# Patient Record
Sex: Male | Born: 1950
Health system: Southern US, Community
[De-identification: ages and names within clinical notes are randomized; demographics above are authoritative.]

## PROBLEM LIST (undated history)

## (undated) DIAGNOSIS — E785 Hyperlipidemia, unspecified: Secondary | ICD-10-CM

## (undated) HISTORY — DX: Hyperlipidemia, unspecified: E78.5

---

## 2005-05-27 ENCOUNTER — Ambulatory Visit: Payer: Self-pay | Admitting: Gastroenterology

## 2005-06-17 ENCOUNTER — Ambulatory Visit: Payer: Self-pay | Admitting: Gastroenterology

## 2005-06-17 HISTORY — PX: COLONOSCOPY: SHX174

## 2013-09-07 ENCOUNTER — Encounter: Payer: Self-pay | Admitting: Gastroenterology

## 2014-10-05 ENCOUNTER — Telehealth: Payer: Self-pay | Admitting: *Deleted

## 2015-06-13 ENCOUNTER — Encounter: Payer: Self-pay | Admitting: Internal Medicine

## 2015-08-09 ENCOUNTER — Ambulatory Visit (AMBULATORY_SURGERY_CENTER): Payer: Self-pay | Admitting: *Deleted

## 2015-08-09 VITALS — Ht 68.0 in | Wt 194.0 lb

## 2015-08-09 DIAGNOSIS — Z1211 Encounter for screening for malignant neoplasm of colon: Secondary | ICD-10-CM

## 2015-08-09 NOTE — Progress Notes (Signed)
No egg or soy allergy known to patient  No issues with past sedation with any surgeries  or procedures, no intubation in the past  No diet pills per patient No home 02 use per patient  No blood thinners per patient

## 2015-08-23 ENCOUNTER — Ambulatory Visit (AMBULATORY_SURGERY_CENTER): Payer: BLUE CROSS/BLUE SHIELD | Admitting: Internal Medicine

## 2015-08-23 ENCOUNTER — Encounter: Payer: Self-pay | Admitting: Internal Medicine

## 2015-08-23 VITALS — BP 116/74 | HR 59 | Temp 97.0°F | Resp 16 | Ht 68.0 in | Wt 194.0 lb

## 2015-08-23 DIAGNOSIS — K635 Polyp of colon: Secondary | ICD-10-CM | POA: Diagnosis not present

## 2015-08-23 DIAGNOSIS — Z1211 Encounter for screening for malignant neoplasm of colon: Secondary | ICD-10-CM

## 2015-08-23 DIAGNOSIS — D120 Benign neoplasm of cecum: Secondary | ICD-10-CM | POA: Diagnosis not present

## 2015-08-23 MED ORDER — SODIUM CHLORIDE 0.9 % IV SOLN: 500.0000 mL | INTRAVENOUS | Status: DC

## 2015-08-23 NOTE — Progress Notes (Signed)
Report to PACU, RN, vss, BBS= Clear.  

## 2015-08-23 NOTE — Progress Notes (Signed)
Called to room to assist during endoscopic procedure.  Patient ID and intended procedure confirmed with present staff. Received instructions for my participation in the procedure from the performing physician.  

## 2015-08-23 NOTE — Op Note (Signed)
[place] Patient Name: Trevor Wu Procedure Date: 08/23/2015 9:50 AM MRN: [id number] Endoscopist: Gatha Mayer , MD Age: 65 Referring MD:  Date of Birth: January 18, 1951 Gender: Male Procedure:                Colonoscopy Indications:              Screening for colorectal malignant neoplasm Medicines:                Propofol total dose 200 mg IV, Monitored Anesthesia                            Care Procedure:                Pre-Anesthesia Assessment:                           - Prior to the procedure, a History and Physical                            was performed, and patient medications and                            allergies were reviewed. The patient's tolerance of                            previous anesthesia was also reviewed. The risks                            and benefits of the procedure and the sedation                            options and risks were discussed with the patient.                            All questions were answered, and informed consent                            was obtained. Prior Anticoagulants: The patient has                            taken no previous anticoagulant or antiplatelet                            agents. ASA Grade Assessment: II - A patient with                            mild systemic disease. After reviewing the risks                            and benefits, the patient was deemed in                            satisfactory condition to undergo the procedure.  After obtaining informed consent, the colonoscope                            was passed under direct vision. Throughout the                            procedure, the patient's blood pressure, pulse, and                            oxygen saturations were monitored continuously. The                            Model CF-HQ190L [id number]) scope was introduced                            through the anus and advanced to the the cecum,                      identified by appendiceal orifice and ileocecal                            valve. The colonoscopy was performed without                            difficulty. The patient tolerated the procedure                            well. The quality of the bowel preparation was                            good. The ileocecal valve, appendiceal orifice, and                            rectum were photographed. The bowel preparation                            used was Miralax. Scope In: 10:02:24 AM Scope Out: 10:13:31 AM Scope Withdrawal Time: 0 hours 9 minutes 21 seconds  Total Procedure Duration: 0 hours 11 minutes 7 seconds  Findings:      The perianal and digital rectal examinations were normal. Pertinent       negatives include normal prostate (size, shape, and consistency).      A 2 mm polyp was found in the ileocecal valve. The polyp was sessile.       The polyp was removed with a cold biopsy forceps. Resection and       retrieval were complete.      Multiple small and large-mouthed diverticula were found in the sigmoid       colon and descending colon. There was narrowing of the colon in       association with the diverticular opening.      The exam was otherwise without abnormality on direct and retroflexion       views. Complications:            No immediate complications. Estimated Blood Loss:     Estimated blood loss: none. Impression:               -  One 2 mm polyp at the ileocecal valve, removed                            with a cold biopsy forceps. Resected and retrieved.                           - Severe diverticulosis in the sigmoid colon and in                            the descending colon. There was narrowing of the                            colon in association with the diverticular opening.                           - The examination was otherwise normal on direct                            and retroflexion views. Recommendation:           - Patient has a  contact number available for                            emergencies. The signs and symptoms of potential                            delayed complications were discussed with the                            patient. Return to normal activities tomorrow.                            Written discharge instructions were provided to the                            patient.                           - Resume previous diet.                           - Continue present medications.                           - Await pathology results.                           - Repeat colonoscopy for surveillance based on                            pathology results. Procedure Code(s):        --- Professional ---                           [phone], Colonoscopy, flexible; with biopsy, single  or multiple CPT copyright 2016 American Medical Association. All rights reserved. Gatha Mayer, MD Gatha Mayer, MD 08/23/2015 10:22:06 AM Number of Addenda: 0

## 2015-08-23 NOTE — Patient Instructions (Addendum)
One tiny polyp (?) removed. You also have a condition called diverticulosis - common and not usually a problem. Please read the handout provided.  I will let you know pathology results and when to have another routine colonoscopy by mail.  I appreciate the opportunity to care for you. Gatha Mayer, MD, FACG  YOU HAD AN ENDOSCOPIC PROCEDURE TODAY AT [facility]:   Refer to the procedure report that was given to you for any specific questions about what was found during the examination.  If the procedure report does not answer your questions, please call your gastroenterologist to clarify.  If you requested that your care partner not be given the details of your procedure findings, then the procedure report has been included in a sealed envelope for you to review at your convenience later.  YOU SHOULD EXPECT: Some feelings of bloating in the abdomen. Passage of more gas than usual.  Walking can help get rid of the air that was put into your GI tract during the procedure and reduce the bloating. If you had a lower endoscopy (such as a colonoscopy or flexible sigmoidoscopy) you may notice spotting of blood in your stool or on the toilet paper. If you underwent a bowel prep for your procedure, you may not have a normal bowel movement for a few days.  Please Note:  You might notice some irritation and congestion in your nose or some drainage.  This is from the oxygen used during your procedure.  There is no need for concern and it should clear up in a day or so.  SYMPTOMS TO REPORT IMMEDIATELY:   Following lower endoscopy (colonoscopy or flexible sigmoidoscopy):  Excessive amounts of blood in the stool  Significant tenderness or worsening of abdominal pains  Swelling of the abdomen that is new, acute  Fever of 100F or higher   For urgent or emergent issues, a gastroenterologist can be reached at any hour by calling [phone].   DIET: Your first meal following  the procedure should be a small meal and then it is ok to progress to your normal diet. Heavy or fried foods are harder to digest and may make you feel nauseous or bloated.  Likewise, meals heavy in dairy and vegetables can increase bloating.  Drink plenty of fluids but you should avoid alcoholic beverages for 24 hours.  ACTIVITY:  You should plan to take it easy for the rest of today and you should NOT DRIVE or use heavy machinery until tomorrow (because of the sedation medicines used during the test).    FOLLOW UP: Our staff will call the number listed on your records the next business day following your procedure to check on you and address any questions or concerns that you may have regarding the information given to you following your procedure. If we do not reach you, we will leave a message.  However, if you are feeling well and you are not experiencing any problems, there is no need to return our call.  We will assume that you have returned to your regular daily activities without incident.  If any biopsies were taken you will be contacted by phone or by letter within the next 1-3 weeks.  Please call us at [phone] if you have not heard about the biopsies in 3 weeks.    SIGNATURES/CONFIDENTIALITY: You and/or your care partner have signed paperwork which will be entered into your electronic medical record.  These signatures attest to the fact  that that the information above on your After Visit Summary has been reviewed and is understood.  Full responsibility of the confidentiality of this discharge information lies with you and/or your care-partner.  Polyp/ diverticulosis handout given Await pathology results Resume medications and diet

## 2015-08-24 ENCOUNTER — Telehealth: Payer: Self-pay | Admitting: Emergency Medicine

## 2015-08-24 NOTE — Telephone Encounter (Signed)
  Follow up Call-  Call back number 08/23/2015  Post procedure Call Back phone  # [phone]  Permission to leave phone message Yes     Patient questions:  Do you have a fever, pain , or abdominal swelling? No. Pain Score  0 *  Have you tolerated food without any problems? Yes.    Have you been able to return to your normal activities? Yes.    Do you have any questions about your discharge instructions: Diet   No. Medications  No. Follow up visit  No.  Do you have questions or concerns about your Care? No.  Actions: * If pain score is 4 or above: No action needed, pain <4.

## 2015-08-31 ENCOUNTER — Encounter: Payer: Self-pay | Admitting: Internal Medicine

## 2015-08-31 NOTE — Progress Notes (Signed)
Quick Note:  Not a precancerous polyp Recall 2027 ______

## 2016-04-16 ENCOUNTER — Other Ambulatory Visit: Payer: Self-pay | Admitting: Physician Assistant

## 2016-09-28 ENCOUNTER — Other Ambulatory Visit: Payer: Self-pay | Admitting: Physician Assistant

## 2016-10-12 ENCOUNTER — Other Ambulatory Visit: Payer: Self-pay | Admitting: Physician Assistant

## 2016-10-24 ENCOUNTER — Encounter: Payer: Self-pay | Admitting: Physician Assistant

## 2016-10-24 ENCOUNTER — Ambulatory Visit (INDEPENDENT_AMBULATORY_CARE_PROVIDER_SITE_OTHER): Payer: BLUE CROSS/BLUE SHIELD | Admitting: Physician Assistant

## 2016-10-24 VITALS — BP 118/64 | HR 54 | Temp 97.5°F | Ht 68.0 in | Wt 185.0 lb

## 2016-10-24 DIAGNOSIS — Z0000 Encounter for general adult medical examination without abnormal findings: Secondary | ICD-10-CM | POA: Diagnosis not present

## 2016-10-24 DIAGNOSIS — E7800 Pure hypercholesterolemia, unspecified: Secondary | ICD-10-CM

## 2016-10-24 DIAGNOSIS — M1712 Unilateral primary osteoarthritis, left knee: Secondary | ICD-10-CM

## 2016-10-24 DIAGNOSIS — I10 Essential (primary) hypertension: Secondary | ICD-10-CM

## 2016-10-24 DIAGNOSIS — K219 Gastro-esophageal reflux disease without esophagitis: Secondary | ICD-10-CM

## 2016-10-24 MED ORDER — HYDROCHLOROTHIAZIDE 25 MG PO TABS: ORAL_TABLET | ORAL | 3 refills | Status: DC

## 2016-10-24 MED ORDER — TAMSULOSIN HCL 0.4 MG PO CAPS: 0.4000 mg | ORAL_CAPSULE | Freq: Every day | ORAL | 3 refills | Status: DC

## 2016-10-24 MED ORDER — ARIPIPRAZOLE 5 MG PO TABS: 5.0000 mg | ORAL_TABLET | Freq: Once | ORAL | 3 refills | Status: DC

## 2016-10-24 MED ORDER — SIMVASTATIN 40 MG PO TABS: 40.0000 mg | ORAL_TABLET | Freq: Every evening | ORAL | 12 refills | Status: DC

## 2016-10-24 MED ORDER — MELOXICAM 7.5 MG PO TABS: 7.5000 mg | ORAL_TABLET | Freq: Every day | ORAL | 3 refills | Status: DC

## 2016-10-24 MED ORDER — OMEPRAZOLE 20 MG PO CPDR: DELAYED_RELEASE_CAPSULE | ORAL | 3 refills | Status: DC

## 2016-10-24 NOTE — Patient Instructions (Signed)
 Health Maintenance, Male A healthy lifestyle and preventive care is important for your health and wellness. Ask your health care provider about what schedule of regular examinations is right for you. What should I know about weight and diet?  Eat a Healthy Diet  Eat plenty of vegetables, fruits, whole grains, low-fat dairy products, and lean protein.  Do not eat a lot of foods high in solid fats, added sugars, or salt. Maintain a Healthy Weight  Regular exercise can help you achieve or maintain a healthy weight. You should:  Do at least 150 minutes of exercise each week. The exercise should increase your heart rate and make you sweat (moderate-intensity exercise).  Do strength-training exercises at least twice a week. Watch Your Levels of Cholesterol and Blood Lipids  Have your blood tested for lipids and cholesterol every 5 years starting at 67 years of age. If you are at high risk for heart disease, you should start having your blood tested when you are 67 years old. You may need to have your cholesterol levels checked more often if:  Your lipid or cholesterol levels are high.  You are older than 67 years of age.  You are at high risk for heart disease. What should I know about cancer screening? Many types of cancers can be detected early and may often be prevented. Lung Cancer  You should be screened every year for lung cancer if:  You are a current smoker who has smoked for at least 30 years.  You are a former smoker who has quit within the past 15 years.  Talk to your health care provider about your screening options, when you should start screening, and how often you should be screened. Colorectal Cancer  Routine colorectal cancer screening usually begins at 67 years of age and should be repeated every 5-10 years until you are 67 years old. You may need to be screened more often if early forms of precancerous polyps or small growths are found. Your health care provider  may recommend screening at an earlier age if you have risk factors for colon cancer.  Your health care provider may recommend using home test kits to check for hidden blood in the stool.  A small camera at the end of a tube can be used to examine your colon (sigmoidoscopy or colonoscopy). This checks for the earliest forms of colorectal cancer. Prostate and Testicular Cancer  Depending on your age and overall health, your health care provider may do certain tests to screen for prostate and testicular cancer.  Talk to your health care provider about any symptoms or concerns you have about testicular or prostate cancer. Skin Cancer  Check your skin from head to toe regularly.  Tell your health care provider about any new moles or changes in moles, especially if:  There is a change in a mole's size, shape, or color.  You have a mole that is larger than a pencil eraser.  Always use sunscreen. Apply sunscreen liberally and repeat throughout the day.  Protect yourself by wearing long sleeves, pants, a wide-brimmed hat, and sunglasses when outside. What should I know about heart disease, diabetes, and high blood pressure?  If you are 18-39 years of age, have your blood pressure checked every 3-5 years. If you are 40 years of age or older, have your blood pressure checked every year. You should have your blood pressure measured twice-once when you are at a hospital or clinic, and once when you are not at   a hospital or clinic. Record the average of the two measurements. To check your blood pressure when you are not at a hospital or clinic, you can use:  An automated blood pressure machine at a pharmacy.  A home blood pressure monitor.  Talk to your health care provider about your target blood pressure.  If you are between 45-79 years old, ask your health care provider if you should take aspirin to prevent heart disease.  Have regular diabetes screenings by checking your fasting blood sugar  level.  If you are at a normal weight and have a low risk for diabetes, have this test once every three years after the age of 45.  If you are overweight and have a high risk for diabetes, consider being tested at a younger age or more often.  A one-time screening for abdominal aortic aneurysm (AAA) by ultrasound is recommended for men aged 65-75 years who are current or former smokers. What should I know about preventing infection? Hepatitis B  If you have a higher risk for hepatitis B, you should be screened for this virus. Talk with your health care provider to find out if you are at risk for hepatitis B infection. Hepatitis C  Blood testing is recommended for:  Everyone born from 1945 through 1965.  Anyone with known risk factors for hepatitis C. Sexually Transmitted Diseases (STDs)  You should be screened each year for STDs including gonorrhea and chlamydia if:  You are sexually active and are younger than 67 years of age.  You are older than 67 years of age and your health care provider tells you that you are at risk for this type of infection.  Your sexual activity has changed since you were last screened and you are at an increased risk for chlamydia or gonorrhea. Ask your health care provider if you are at risk.  Talk with your health care provider about whether you are at high risk of being infected with HIV. Your health care provider may recommend a prescription medicine to help prevent HIV infection. What else can I do?  Schedule regular health, dental, and eye exams.  Stay current with your vaccines (immunizations).  Do not use any tobacco products, such as cigarettes, chewing tobacco, and e-cigarettes. If you need help quitting, ask your health care provider.  Limit alcohol intake to no more than 2 drinks per day. One drink equals 12 ounces of beer, 5 ounces of wine, or 1 ounces of hard liquor.  Do not use street drugs.  Do not share needles.  Ask your health  care provider for help if you need support or information about quitting drugs.  Tell your health care provider if you often feel depressed.  Tell your health care provider if you have ever been abused or do not feel safe at home. This information is not intended to replace advice given to you by your health care provider. Make sure you discuss any questions you have with your health care provider. Document Released: 11/23/2007 Document Revised: 01/24/2016 Document Reviewed: 02/28/2015 Elsevier Interactive Patient Education  2017 Elsevier Inc.  

## 2016-10-25 DIAGNOSIS — M1712 Unilateral primary osteoarthritis, left knee: Secondary | ICD-10-CM | POA: Insufficient documentation

## 2016-10-25 DIAGNOSIS — Z0000 Encounter for general adult medical examination without abnormal findings: Secondary | ICD-10-CM | POA: Insufficient documentation

## 2016-10-25 DIAGNOSIS — I10 Essential (primary) hypertension: Secondary | ICD-10-CM | POA: Insufficient documentation

## 2016-10-25 DIAGNOSIS — K219 Gastro-esophageal reflux disease without esophagitis: Secondary | ICD-10-CM | POA: Insufficient documentation

## 2016-10-25 DIAGNOSIS — E7800 Pure hypercholesterolemia, unspecified: Secondary | ICD-10-CM | POA: Insufficient documentation

## 2016-10-25 LAB — CBC WITH DIFFERENTIAL/PLATELET
Basophils Absolute: 0.1 10*3/uL (ref 0.0–0.2)
Basos: 1 %
EOS (ABSOLUTE): 0.5 10*3/uL — ABNORMAL HIGH (ref 0.0–0.4)
Eos: 6 %
Hematocrit: 41.7 % (ref 37.5–51.0)
Hemoglobin: 14.5 g/dL (ref 13.0–17.7)
Immature Grans (Abs): 0 10*3/uL (ref 0.0–0.1)
Immature Granulocytes: 0 %
Lymphocytes Absolute: 2.5 10*3/uL (ref 0.7–3.1)
Lymphs: 27 %
MCH: 31.3 pg (ref 26.6–33.0)
MCHC: 34.8 g/dL (ref 31.5–35.7)
MCV: 90 fL (ref 79–97)
Monocytes Absolute: 0.6 10*3/uL (ref 0.1–0.9)
Monocytes: 7 %
Neutrophils Absolute: 5.6 10*3/uL (ref 1.4–7.0)
Neutrophils: 59 %
Platelets: 294 10*3/uL (ref 150–379)
RBC: 4.63 x10E6/uL (ref 4.14–5.80)
RDW: 13.1 % (ref 12.3–15.4)
WBC: 9.3 10*3/uL (ref 3.4–10.8)

## 2016-10-25 LAB — CMP14+EGFR
ALT: 14 IU/L (ref 0–44)
AST: 16 IU/L (ref 0–40)
Albumin/Globulin Ratio: 1.7 (ref 1.2–2.2)
Albumin: 4.2 g/dL (ref 3.6–4.8)
Alkaline Phosphatase: 60 IU/L (ref 39–117)
BUN/Creatinine Ratio: 12 (ref 10–24)
BUN: 13 mg/dL (ref 8–27)
Bilirubin Total: 0.3 mg/dL (ref 0.0–1.2)
CO2: 23 mmol/L (ref 18–29)
Calcium: 9.6 mg/dL (ref 8.6–10.2)
Chloride: 103 mmol/L (ref 96–106)
Creatinine, Ser: 1.1 mg/dL (ref 0.76–1.27)
GFR calc Af Amer: 80 mL/min/{1.73_m2} (ref >59)
GFR calc non Af Amer: 69 mL/min/{1.73_m2} (ref >59)
Globulin, Total: 2.5 g/dL (ref 1.5–4.5)
Glucose: 91 mg/dL (ref 65–99)
Potassium: 4.4 mmol/L (ref 3.5–5.2)
Sodium: 140 mmol/L (ref 134–144)
Total Protein: 6.7 g/dL (ref 6.0–8.5)

## 2016-10-25 LAB — LIPID PANEL
Chol/HDL Ratio: 4.5 ratio (ref 0.0–5.0)
Cholesterol, Total: 168 mg/dL (ref 100–199)
HDL: 37 mg/dL — ABNORMAL LOW (ref >39)
LDL Calculated: 102 mg/dL — ABNORMAL HIGH (ref 0–99)
Triglycerides: 144 mg/dL (ref 0–149)
VLDL Cholesterol Cal: 29 mg/dL (ref 5–40)

## 2016-10-25 LAB — PSA: Prostate Specific Ag, Serum: 0.4 ng/mL (ref 0.0–4.0)

## 2016-10-25 NOTE — Progress Notes (Signed)
BP 118/64   Pulse (!) 54   Temp 97.5 F (36.4 C) (Oral)   Ht '5\' 8"'  (1.727 m)   Wt 185 lb (83.9 kg)   BMI 28.13 kg/m    Subjective:    Patient ID: Trevor Wu, male    DOB: 1950/08/08, 66 y.o.   MRN: [id number]  HPI: Trevor Wu is a 66 y.o. male presenting on 10/24/2016 for New Patient (Initial Visit) (pt here today for CPE and also c/o slight "clicking" in left knee.)  This patient comes in for annual well physical examination. All medications are reviewed today. There are no reports of any problems with the medications. All of the medical conditions are reviewed and updated.  Lab work is reviewed and will be ordered as medically necessary.  Knee is still very painful at times, but does not want to have a replacement performed yet. Lots of stressors this year due to wife's breast cancer.   Past Medical History:  Diagnosis Date  . Hyperlipidemia    Relevant past medical, surgical, family and social history reviewed and updated as indicated. Interim medical history since our last visit reviewed. Allergies and medications reviewed and updated. DATA REVIEWED: CHART IN EPIC  Social History   Social History  . Marital status: Married    Spouse name: N/A  . Number of children: N/A  . Years of education: N/A   Occupational History  . Not on file.   Social History Main Topics  . Smoking status: Former Research scientist (life sciences)  . Smokeless tobacco: Never Used  . Alcohol use 0.0 oz/week     Comment: 1-2 beers 1-2 times a month   . Drug use: No  . Sexual activity: Not on file   Other Topics Concern  . Not on file   Social History Narrative  . No narrative on file    Past Surgical History:  Procedure Laterality Date  . COLONOSCOPY  06-17-2005   tics    Family History  Problem Relation Age of Onset  . Cancer Mother 21       "male cancer" per pt.  . Colon cancer Neg Hx   . Colon polyps Neg Hx     Review of Systems  Allergies as of 10/24/2016   No Known Allergies       Medication List       Accurate as of 10/24/16 11:59 PM. Always use your most recent med list.          acetaminophen 500 MG tablet Commonly known as:  TYLENOL Take 500 mg by mouth every 6 (six) hours as needed.   ARIPiprazole 5 MG tablet Commonly known as:  ABILIFY Take 1 tablet (5 mg total) by mouth once.   Fish Oil 1000 MG Caps Take 1,000 mg by mouth daily.   hydrochlorothiazide 25 MG tablet Commonly known as:  HYDRODIURIL 1/2-1 TABLET ONCE A DAY FOR FLUID ORALLY 90 DAYS   meloxicam 7.5 MG tablet Commonly known as:  MOBIC Take 1 tablet (7.5 mg total) by mouth daily.   omeprazole 20 MG capsule Commonly known as:  PRILOSEC TAKE ONE CAPSULE BY MOUTH TWICE A DAY FOR 90 DAYS   simvastatin 40 MG tablet Commonly known as:  ZOCOR Take 1 tablet (40 mg total) by mouth every evening.   tamsulosin 0.4 MG Caps capsule Commonly known as:  FLOMAX Take 1 capsule (0.4 mg total) by mouth daily.   vitamin C 500 MG tablet Commonly known as:  ASCORBIC ACID Take  500 mg by mouth daily.          Objective:    BP 118/64   Pulse (!) 54   Temp 97.5 F (36.4 C) (Oral)   Ht '5\' 8"'  (1.727 m)   Wt 185 lb (83.9 kg)   BMI 28.13 kg/m   No Known Allergies  Wt Readings from Last 3 Encounters:  10/24/16 185 lb (83.9 kg)  08/23/15 194 lb (88 kg)  08/09/15 194 lb (88 kg)    Physical Exam  Results for orders placed or performed in visit on 10/24/16  CMP14+EGFR  Result Value Ref Range   Glucose 91 65 - 99 mg/dL   BUN 13 8 - 27 mg/dL   Creatinine, Ser 1.10 0.76 - 1.27 mg/dL   GFR calc non Af Amer 69 >59 mL/min/1.73   GFR calc Af Amer 80 >59 mL/min/1.73   BUN/Creatinine Ratio 12 10 - 24   Sodium 140 134 - 144 mmol/L   Potassium 4.4 3.5 - 5.2 mmol/L   Chloride 103 96 - 106 mmol/L   CO2 23 18 - 29 mmol/L   Calcium 9.6 8.6 - 10.2 mg/dL   Total Protein 6.7 6.0 - 8.5 g/dL   Albumin 4.2 3.6 - 4.8 g/dL   Globulin, Total 2.5 1.5 - 4.5 g/dL   Albumin/Globulin Ratio 1.7 1.2 - 2.2    Bilirubin Total 0.3 0.0 - 1.2 mg/dL   Alkaline Phosphatase 60 39 - 117 IU/L   AST 16 0 - 40 IU/L   ALT 14 0 - 44 IU/L  CBC with Differential/Platelet  Result Value Ref Range   WBC 9.3 3.4 - 10.8 x10E3/uL   RBC 4.63 4.14 - 5.80 x10E6/uL   Hemoglobin 14.5 13.0 - 17.7 g/dL   Hematocrit 41.7 37.5 - 51.0 %   MCV 90 79 - 97 fL   MCH 31.3 26.6 - 33.0 pg   MCHC 34.8 31.5 - 35.7 g/dL   RDW 13.1 12.3 - 15.4 %   Platelets 294 150 - 379 x10E3/uL   Neutrophils 59 Not Estab. %   Lymphs 27 Not Estab. %   Monocytes 7 Not Estab. %   Eos 6 Not Estab. %   Basos 1 Not Estab. %   Neutrophils Absolute 5.6 1.4 - 7.0 x10E3/uL   Lymphocytes Absolute 2.5 0.7 - 3.1 x10E3/uL   Monocytes Absolute 0.6 0.1 - 0.9 x10E3/uL   EOS (ABSOLUTE) 0.5 (H) 0.0 - 0.4 x10E3/uL   Basophils Absolute 0.1 0.0 - 0.2 x10E3/uL   Immature Granulocytes 0 Not Estab. %   Immature Grans (Abs) 0.0 0.0 - 0.1 x10E3/uL  Lipid panel  Result Value Ref Range   Cholesterol, Total 168 100 - 199 mg/dL   Triglycerides 144 0 - 149 mg/dL   HDL 37 (L) >39 mg/dL   VLDL Cholesterol Cal 29 5 - 40 mg/dL   LDL Calculated 102 (H) 0 - 99 mg/dL   Chol/HDL Ratio 4.5 0.0 - 5.0 ratio  PSA  Result Value Ref Range   Prostate Specific Ag, Serum 0.4 0.0 - 4.0 ng/mL      Assessment & Plan:   1. Well adult exam - ARIPiprazole (ABILIFY) 5 MG tablet; Take 1 tablet (5 mg total) by mouth once.  Dispense: 90 tablet; Refill: 3 - hydrochlorothiazide (HYDRODIURIL) 25 MG tablet; 1/2-1 TABLET ONCE A DAY FOR FLUID ORALLY 90 DAYS  Dispense: 90 tablet; Refill: 3 - meloxicam (MOBIC) 7.5 MG tablet; Take 1 tablet (7.5 mg total) by mouth daily.  Dispense: 90  tablet; Refill: 3 - omeprazole (PRILOSEC) 20 MG capsule; TAKE ONE CAPSULE BY MOUTH TWICE A DAY FOR 90 DAYS  Dispense: 180 capsule; Refill: 3 - tamsulosin (FLOMAX) 0.4 MG CAPS capsule; Take 1 capsule (0.4 mg total) by mouth daily.  Dispense: 90 capsule; Refill: 3 - simvastatin (ZOCOR) 40 MG tablet; Take 1 tablet (40  mg total) by mouth every evening.  Dispense: 30 tablet; Refill: 12 - CMP14+EGFR - CBC with Differential/Platelet - Lipid panel - PSA  2. Pure hypercholesterolemia  3. Primary osteoarthritis of left knee  4. Gastroesophageal reflux disease without esophagitis  5. Essential hypertension   Current Outpatient Prescriptions:  .  acetaminophen (TYLENOL) 500 MG tablet, Take 500 mg by mouth every 6 (six) hours as needed., Disp: , Rfl:  .  hydrochlorothiazide (HYDRODIURIL) 25 MG tablet, 1/2-1 TABLET ONCE A DAY FOR FLUID ORALLY 90 DAYS, Disp: 90 tablet, Rfl: 3 .  meloxicam (MOBIC) 7.5 MG tablet, Take 1 tablet (7.5 mg total) by mouth daily., Disp: 90 tablet, Rfl: 3 .  Omega-3 Fatty Acids (FISH OIL) 1000 MG CAPS, Take 1,000 mg by mouth daily., Disp: , Rfl:  .  omeprazole (PRILOSEC) 20 MG capsule, TAKE ONE CAPSULE BY MOUTH TWICE A DAY FOR 90 DAYS, Disp: 180 capsule, Rfl: 3 .  simvastatin (ZOCOR) 40 MG tablet, Take 1 tablet (40 mg total) by mouth every evening., Disp: 30 tablet, Rfl: 12 .  tamsulosin (FLOMAX) 0.4 MG CAPS capsule, Take 1 capsule (0.4 mg total) by mouth daily., Disp: 90 capsule, Rfl: 3 .  vitamin C (ASCORBIC ACID) 500 MG tablet, Take 500 mg by mouth daily., Disp: , Rfl:  .  ARIPiprazole (ABILIFY) 5 MG tablet, Take 1 tablet (5 mg total) by mouth once., Disp: 90 tablet, Rfl: 3  Continue all other maintenance medications as listed above.  Follow up plan: Return in about 1 year (around 10/24/2017) for recheck.  Educational handout given for health [place] PA-C [place] [address]  [place], [place] [address] [phone]   10/25/2016, 9:18 AM

## 2017-06-06 ENCOUNTER — Other Ambulatory Visit: Payer: Self-pay | Admitting: Physician Assistant

## 2017-06-06 MED ORDER — RISPERIDONE 1 MG PO TABS: 1.0000 mg | ORAL_TABLET | Freq: Every day | ORAL | 11 refills | Status: DC

## 2017-10-20 ENCOUNTER — Other Ambulatory Visit: Payer: Self-pay | Admitting: Physician Assistant

## 2017-10-20 DIAGNOSIS — Z0000 Encounter for general adult medical examination without abnormal findings: Secondary | ICD-10-CM

## 2017-10-30 ENCOUNTER — Other Ambulatory Visit: Payer: Self-pay | Admitting: Physician Assistant

## 2017-10-30 DIAGNOSIS — Z0000 Encounter for general adult medical examination without abnormal findings: Secondary | ICD-10-CM

## 2017-11-06 ENCOUNTER — Encounter: Payer: BLUE CROSS/BLUE SHIELD | Admitting: Physician Assistant

## 2017-11-25 ENCOUNTER — Encounter: Payer: Self-pay | Admitting: Physician Assistant

## 2017-11-25 ENCOUNTER — Ambulatory Visit (INDEPENDENT_AMBULATORY_CARE_PROVIDER_SITE_OTHER): Payer: BLUE CROSS/BLUE SHIELD | Admitting: Physician Assistant

## 2017-11-25 VITALS — BP 123/75 | HR 60 | Temp 98.0°F | Ht 68.0 in | Wt 182.6 lb

## 2017-11-25 DIAGNOSIS — G5602 Carpal tunnel syndrome, left upper limb: Secondary | ICD-10-CM

## 2017-11-25 DIAGNOSIS — Z0000 Encounter for general adult medical examination without abnormal findings: Secondary | ICD-10-CM | POA: Diagnosis not present

## 2017-11-25 DIAGNOSIS — L570 Actinic keratosis: Secondary | ICD-10-CM

## 2017-11-25 MED ORDER — SIMVASTATIN 40 MG PO TABS: 40.0000 mg | ORAL_TABLET | Freq: Every evening | ORAL | 3 refills | Status: DC

## 2017-11-25 MED ORDER — HYDROCHLOROTHIAZIDE 25 MG PO TABS: ORAL_TABLET | ORAL | 3 refills | Status: DC

## 2017-11-25 MED ORDER — RISPERIDONE 1 MG PO TABS: 1.0000 mg | ORAL_TABLET | Freq: Every day | ORAL | 3 refills | Status: DC

## 2017-11-25 MED ORDER — TAMSULOSIN HCL 0.4 MG PO CAPS: 0.4000 mg | ORAL_CAPSULE | Freq: Every day | ORAL | 3 refills | Status: DC

## 2017-11-25 MED ORDER — OMEPRAZOLE 20 MG PO CPDR: DELAYED_RELEASE_CAPSULE | ORAL | 3 refills | Status: DC

## 2017-11-25 MED ORDER — MELOXICAM 7.5 MG PO TABS: 7.5000 mg | ORAL_TABLET | Freq: Every day | ORAL | 3 refills | Status: DC

## 2017-11-25 NOTE — Progress Notes (Signed)
Left  Asleep Carpal tunnel Right ear lesion     BP 123/75   Pulse 60   Temp 98 F (36.7 C) (Oral)   Ht '5\' 8"'  (1.727 m)   Wt 182 lb 9.6 oz (82.8 kg)   BMI 27.76 kg/m    Subjective:    Patient ID: Trevor Wu, male    DOB: October 06, 1950, 67 y.o.   MRN: [id number]  HPI: Trevor Wu is a 67 y.o. male presenting on 11/25/2017 for Annual Exam This patient comes in for annual well physical examination. All medications are reviewed today. There are no reports of any problems with the medications. All of the medical conditions are reviewed and updated.  Lab work is reviewed and will be ordered as medically necessary.   The patient does report several months of left hand numbness.  It is primarily from the wrist down to the fingertips.  He is not dropping anything.  The worst numbness is through the 3 middle fingers.  He has not tried anything for it.  He does take an anti-inflammatory. He also has an area of irritation on the right ear.  It is in his sun exposed area.  He states that it sometimes will scaling peel off.  Past Medical History:  Diagnosis Date  . Hyperlipidemia    Relevant past medical, surgical, family and social history reviewed and updated as indicated. Interim medical history since our last visit reviewed. Allergies and medications reviewed and updated. DATA REVIEWED: CHART IN EPIC  Family History reviewed for pertinent findings.  Review of Systems  Constitutional: Negative.  Negative for appetite change and fatigue.  HENT: Negative.   Eyes: Negative.  Negative for pain and visual disturbance.  Respiratory: Negative.  Negative for cough, chest tightness, shortness of breath and wheezing.   Cardiovascular: Negative.  Negative for chest pain, palpitations and leg swelling.  Gastrointestinal: Negative.  Negative for abdominal pain, diarrhea, nausea and vomiting.  Endocrine: Negative.   Genitourinary: Negative.   Musculoskeletal: Positive for arthralgias and  joint swelling.  Skin: Positive for color change. Negative for rash.  Neurological: Positive for numbness. Negative for weakness and headaches.  Psychiatric/Behavioral: Negative.     Allergies as of 11/25/2017   No Known Allergies     Medication List        Accurate as of 11/25/17  1:42 PM. Always use your most recent med list.          acetaminophen 500 MG tablet Commonly known as:  TYLENOL Take 500 mg by mouth every 6 (six) hours as needed.   Fish Oil 1000 MG Caps Take 1,000 mg by mouth daily.   hydrochlorothiazide 25 MG tablet Commonly known as:  HYDRODIURIL 1/2-1 TABLET ONCE A DAY FOR FLUID ORALLY 90 DAYS   meloxicam 7.5 MG tablet Commonly known as:  MOBIC Take 1 tablet (7.5 mg total) by mouth daily.   omeprazole 20 MG capsule Commonly known as:  PRILOSEC TAKE ONE CAPSULE BY MOUTH TWICE A DAY FOR 90 DAYS   risperiDONE 1 MG tablet Commonly known as:  RISPERDAL Take 1 tablet (1 mg total) by mouth at bedtime.   simvastatin 40 MG tablet Commonly known as:  ZOCOR Take 1 tablet (40 mg total) by mouth every evening.   tamsulosin 0.4 MG Caps capsule Commonly known as:  FLOMAX Take 1 capsule (0.4 mg total) by mouth daily.   vitamin C 500 MG tablet Commonly known as:  ASCORBIC ACID Take 500 mg by mouth daily.  Objective:    BP 123/75   Pulse 60   Temp 98 F (36.7 C) (Oral)   Ht '5\' 8"'  (1.727 m)   Wt 182 lb 9.6 oz (82.8 kg)   BMI 27.76 kg/m   No Known Allergies  Wt Readings from Last 3 Encounters:  11/25/17 182 lb 9.6 oz (82.8 kg)  10/24/16 185 lb (83.9 kg)  08/23/15 194 lb (88 kg)    Physical Exam  Constitutional: He appears well-developed and well-nourished.  HENT:  Head: Normocephalic and atraumatic.  Eyes: Pupils are equal, round, and reactive to light. Conjunctivae and EOM are normal.  Neck: Normal range of motion. Neck supple.  Cardiovascular: Normal rate, regular rhythm and normal heart sounds.  Pulmonary/Chest: Effort normal and  breath sounds normal.  Abdominal: Soft. Bowel sounds are normal.  Musculoskeletal: Normal range of motion.       Right wrist: He exhibits tenderness and deformity.  Positive Phalen's  Neurological: No sensory deficit.  Skin: Skin is warm and dry. Lesion and rash noted. Rash is macular.           Assessment & Plan:   1. Well adult exam - hydrochlorothiazide (HYDRODIURIL) 25 MG tablet; 1/2-1 TABLET ONCE A DAY FOR FLUID ORALLY 90 DAYS  Dispense: 90 tablet; Refill: 3 - meloxicam (MOBIC) 7.5 MG tablet; Take 1 tablet (7.5 mg total) by mouth daily.  Dispense: 90 tablet; Refill: 3 - omeprazole (PRILOSEC) 20 MG capsule; TAKE ONE CAPSULE BY MOUTH TWICE A DAY FOR 90 DAYS  Dispense: 180 capsule; Refill: 3 - risperiDONE (RISPERDAL) 1 MG tablet; Take 1 tablet (1 mg total) by mouth at bedtime.  Dispense: 90 tablet; Refill: 3 - simvastatin (ZOCOR) 40 MG tablet; Take 1 tablet (40 mg total) by mouth every evening.  Dispense: 90 tablet; Refill: 3 - tamsulosin (FLOMAX) 0.4 MG CAPS capsule; Take 1 capsule (0.4 mg total) by mouth daily.  Dispense: 90 capsule; Refill: 3 - CBC with Differential/Platelet - CMP14+EGFR - Lipid panel - PSA  2. Carpal tunnel syndrome of left wrist Splint as much as possible  3. Actinic keratosis Use cream and sunscreen and hats   Continue all other maintenance medications as listed above.  Follow up plan: Return in about 1 year (around 11/26/2018) for recheck.  Educational handout given for [place] PA-C [place] [address]  [place], [place] [address] [phone]   11/25/2017, 1:42 PM

## 2017-11-25 NOTE — Patient Instructions (Signed)
Carpal Tunnel Syndrome Carpal tunnel syndrome is a condition that causes pain in your hand and arm. The carpal tunnel is a narrow area that is on the palm side of your wrist. Repeated wrist motion or certain diseases may cause swelling in the tunnel. This swelling can pinch the main nerve in the wrist (median nerve). Follow these instructions at home: If you have a splint:  Wear it as told by your doctor. Remove it only as told by your doctor.  Loosen the splint if your fingers: ? Become numb and tingle. ? Turn blue and cold.  Keep the splint clean and dry. General instructions  Take over-the-counter and prescription medicines only as told by your doctor.  Rest your wrist from any activity that may be causing your pain. If needed, talk to your employer about changes that can be made in your work, such as getting a wrist pad to use while typing.  If directed, apply ice to the painful area: ? Put ice in a plastic bag. ? Place a towel between your skin and the bag. ? Leave the ice on for 20 minutes, 2-3 times per day.  Keep all follow-up visits as told by your doctor. This is important.  Do any exercises as told by your doctor, physical therapist, or occupational therapist. Contact a doctor if:  You have new symptoms.  Medicine does not help your pain.  Your symptoms get worse. This information is not intended to replace advice given to you by your health care provider. Make sure you discuss any questions you have with your health care provider. Document Released: 05/16/2011 Document Revised: 11/02/2015 Document Reviewed: 10/12/2014 Elsevier Interactive Patient Education  2018 Elsevier Inc.  

## 2017-11-26 LAB — CBC WITH DIFFERENTIAL/PLATELET
Basophils Absolute: 0 10*3/uL (ref 0.0–0.2)
Basos: 1 %
EOS (ABSOLUTE): 0.3 10*3/uL (ref 0.0–0.4)
Eos: 5 %
Hematocrit: 38.5 % (ref 37.5–51.0)
Hemoglobin: 13.1 g/dL (ref 13.0–17.7)
Immature Grans (Abs): 0 10*3/uL (ref 0.0–0.1)
Immature Granulocytes: 0 %
Lymphocytes Absolute: 1.7 10*3/uL (ref 0.7–3.1)
Lymphs: 24 %
MCH: 30.5 pg (ref 26.6–33.0)
MCHC: 34 g/dL (ref 31.5–35.7)
MCV: 90 fL (ref 79–97)
Monocytes Absolute: 0.7 10*3/uL (ref 0.1–0.9)
Monocytes: 10 %
Neutrophils Absolute: 4.1 10*3/uL (ref 1.4–7.0)
Neutrophils: 60 %
Platelets: 255 10*3/uL (ref 150–450)
RBC: 4.29 x10E6/uL (ref 4.14–5.80)
RDW: 13.7 % (ref 12.3–15.4)
WBC: 6.9 10*3/uL (ref 3.4–10.8)

## 2017-11-26 LAB — CMP14+EGFR
ALT: 12 IU/L (ref 0–44)
AST: 15 IU/L (ref 0–40)
Albumin/Globulin Ratio: 1.7 (ref 1.2–2.2)
Albumin: 4.1 g/dL (ref 3.6–4.8)
Alkaline Phosphatase: 57 IU/L (ref 39–117)
BUN/Creatinine Ratio: 16 (ref 10–24)
BUN: 22 mg/dL (ref 8–27)
Bilirubin Total: 0.3 mg/dL (ref 0.0–1.2)
CO2: 21 mmol/L (ref 20–29)
Calcium: 9.2 mg/dL (ref 8.6–10.2)
Chloride: 104 mmol/L (ref 96–106)
Creatinine, Ser: 1.4 mg/dL — ABNORMAL HIGH (ref 0.76–1.27)
GFR calc Af Amer: 59 mL/min/{1.73_m2} — ABNORMAL LOW (ref >59)
GFR calc non Af Amer: 51 mL/min/{1.73_m2} — ABNORMAL LOW (ref >59)
Globulin, Total: 2.4 g/dL (ref 1.5–4.5)
Glucose: 106 mg/dL — ABNORMAL HIGH (ref 65–99)
Potassium: 4.4 mmol/L (ref 3.5–5.2)
Sodium: 141 mmol/L (ref 134–144)
Total Protein: 6.5 g/dL (ref 6.0–8.5)

## 2017-11-26 LAB — PSA: Prostate Specific Ag, Serum: 0.3 ng/mL (ref 0.0–4.0)

## 2017-11-26 LAB — LIPID PANEL
Chol/HDL Ratio: 4.6 ratio (ref 0.0–5.0)
Cholesterol, Total: 157 mg/dL (ref 100–199)
HDL: 34 mg/dL — ABNORMAL LOW (ref >39)
LDL Calculated: 96 mg/dL (ref 0–99)
Triglycerides: 134 mg/dL (ref 0–149)
VLDL Cholesterol Cal: 27 mg/dL (ref 5–40)

## 2018-06-24 ENCOUNTER — Other Ambulatory Visit: Payer: Self-pay | Admitting: Physician Assistant

## 2018-06-24 DIAGNOSIS — Z0000 Encounter for general adult medical examination without abnormal findings: Secondary | ICD-10-CM

## 2018-06-24 MED ORDER — RISPERIDONE 1 MG PO TABS: 1.0000 mg | ORAL_TABLET | Freq: Every day | ORAL | 3 refills | Status: DC

## 2018-10-09 ENCOUNTER — Ambulatory Visit (INDEPENDENT_AMBULATORY_CARE_PROVIDER_SITE_OTHER): Payer: BC Managed Care – PPO | Admitting: Physician Assistant

## 2018-10-09 ENCOUNTER — Other Ambulatory Visit: Payer: Self-pay

## 2018-10-09 DIAGNOSIS — R42 Dizziness and giddiness: Secondary | ICD-10-CM | POA: Diagnosis not present

## 2018-10-09 MED ORDER — MECLIZINE HCL 25 MG PO TABS: 25.0000 mg | ORAL_TABLET | Freq: Three times a day (TID) | ORAL | 2 refills | Status: AC | PRN

## 2018-10-11 ENCOUNTER — Encounter: Payer: Self-pay | Admitting: Physician Assistant

## 2018-10-11 NOTE — Progress Notes (Signed)
     Telephone visit  Subjective: KC:[id number] PCP: Terald Sleeper, PA-C TAV:WPVXYIA Trevor Wu is a 68 y.o. male calls for telephone consult today. Patient provides verbal consent for consult held via phone.  Patient is identified with 2 separate identifiers.  At this time the entire area is on COVID-19 social distancing and stay home orders are in place.  Patient is of higher risk and therefore we are performing this by a virtual method.  Location of patient: home Location of provider: [facility] Others present for call: no  This patient has had 2 episodes of dizziness.  2 days prior to yesterday he: He was walking and felt the dizziness, vomiting, and excessive quickly.  He has had severe dizziness, but denies drinking coffee in the morning he was walking against because to get nauseous and he had to vomit.  He laid down and slept for several hours.  He denies any fever or chills.  He states he did have some pressure in his head, some ringing in his ears and that still present.  And he is states he has not had any severe vision changes.  He does not know of any family history of vertigo.   ROS: Per HPI  No Known Allergies Past Medical History:  Diagnosis Date  . Hyperlipidemia     Current Outpatient Medications:  .  acetaminophen (TYLENOL) 500 MG tablet, Take 500 mg by mouth every 6 (six) hours as needed., Disp: , Rfl:  .  hydrochlorothiazide (HYDRODIURIL) 25 MG tablet, 1/2-1 TABLET ONCE A DAY FOR FLUID ORALLY 90 DAYS, Disp: 90 tablet, Rfl: 3 .  meclizine (ANTIVERT) 25 MG tablet, Take 1 tablet (25 mg total) by mouth 3 (three) times daily as needed for dizziness., Disp: 60 tablet, Rfl: 2 .  meloxicam (MOBIC) 7.5 MG tablet, Take 1 tablet (7.5 mg total) by mouth daily., Disp: 90 tablet, Rfl: 3 .  Omega-3 Fatty Acids (FISH OIL) 1000 MG CAPS, Take 1,000 mg by mouth daily., Disp: , Rfl:  .  omeprazole (PRILOSEC) 20 MG capsule, TAKE ONE CAPSULE BY MOUTH TWICE A DAY FOR 90 DAYS, Disp: 180  capsule, Rfl: 3 .  risperiDONE (RISPERDAL) 1 MG tablet, Take 1-2 tablets (1-2 mg total) by mouth at bedtime., Disp: 180 tablet, Rfl: 3 .  simvastatin (ZOCOR) 40 MG tablet, Take 1 tablet (40 mg total) by mouth every evening., Disp: 90 tablet, Rfl: 3 .  tamsulosin (FLOMAX) 0.4 MG CAPS capsule, Take 1 capsule (0.4 mg total) by mouth daily., Disp: 90 capsule, Rfl: 3 .  vitamin C (ASCORBIC ACID) 500 MG tablet, Take 500 mg by mouth daily., Disp: , Rfl:   Assessment/ Plan: 68 y.o. male   1. Vertigo - meclizine (ANTIVERT) 25 MG tablet; Take 1 tablet (25 mg total) by mouth 3 (three) times daily as needed for dizziness.  Dispense: 60 tablet; Refill: 2  Start time: 2:52 PM End time: 3:05 PM  Meds ordered this encounter  Medications  . meclizine (ANTIVERT) 25 MG tablet    Sig: Take 1 tablet (25 mg total) by mouth 3 (three) times daily as needed for dizziness.    Dispense:  60 tablet    Refill:  2    Order Specific Question:   Supervising Provider    Answer:   Janora Norlander [[id number]]    Particia Nearing PA-C [place] [phone]

## 2018-11-12 ENCOUNTER — Other Ambulatory Visit: Payer: Self-pay

## 2018-11-13 ENCOUNTER — Encounter: Payer: Self-pay | Admitting: Physician Assistant

## 2018-11-13 ENCOUNTER — Ambulatory Visit (INDEPENDENT_AMBULATORY_CARE_PROVIDER_SITE_OTHER): Payer: BC Managed Care – PPO | Admitting: Physician Assistant

## 2018-11-13 ENCOUNTER — Other Ambulatory Visit: Payer: Self-pay

## 2018-11-13 VITALS — BP 130/73 | HR 55 | Temp 97.0°F | Ht 68.0 in | Wt 178.4 lb

## 2018-11-13 DIAGNOSIS — Z0001 Encounter for general adult medical examination with abnormal findings: Secondary | ICD-10-CM | POA: Diagnosis not present

## 2018-11-13 DIAGNOSIS — B360 Pityriasis versicolor: Secondary | ICD-10-CM | POA: Diagnosis not present

## 2018-11-13 DIAGNOSIS — L989 Disorder of the skin and subcutaneous tissue, unspecified: Secondary | ICD-10-CM | POA: Diagnosis not present

## 2018-11-13 DIAGNOSIS — Z0000 Encounter for general adult medical examination without abnormal findings: Secondary | ICD-10-CM

## 2018-11-13 MED ORDER — OMEPRAZOLE 20 MG PO CPDR: DELAYED_RELEASE_CAPSULE | ORAL | 3 refills | Status: DC

## 2018-11-13 MED ORDER — MELOXICAM 7.5 MG PO TABS: 7.5000 mg | ORAL_TABLET | Freq: Every day | ORAL | 3 refills | Status: AC

## 2018-11-13 MED ORDER — NYSTATIN-TRIAMCINOLONE 100000-0.1 UNIT/GM-% EX OINT: 1.0000 "application " | TOPICAL_OINTMENT | Freq: Two times a day (BID) | CUTANEOUS | 2 refills | Status: AC

## 2018-11-13 MED ORDER — RISPERIDONE 1 MG PO TABS: 1.0000 mg | ORAL_TABLET | Freq: Every day | ORAL | 3 refills | Status: AC

## 2018-11-13 MED ORDER — SIMVASTATIN 40 MG PO TABS: 40.0000 mg | ORAL_TABLET | Freq: Every evening | ORAL | 3 refills | Status: AC

## 2018-11-13 MED ORDER — HYDROCHLOROTHIAZIDE 25 MG PO TABS: ORAL_TABLET | ORAL | 3 refills | Status: AC

## 2018-11-13 MED ORDER — TAMSULOSIN HCL 0.4 MG PO CAPS: 0.4000 mg | ORAL_CAPSULE | Freq: Every day | ORAL | 3 refills | Status: DC

## 2018-11-13 NOTE — Progress Notes (Signed)
BP 130/73   Pulse (!) 55   Temp (!) 97 F (36.1 C) (Oral)   Ht '5\' 8"'  (1.727 m)   Wt 178 lb 6.4 oz (80.9 kg)   BMI 27.13 kg/m    Subjective:    Patient ID: Trevor Wu, male    DOB: Nov 03, 1950, 68 y.o.   MRN: [id number]  HPI: Trevor Wu is a 68 y.o. male presenting on 11/13/2018 for Medical Management of Chronic Issues (due lab work)  This patient comes in for annual well physical examination. All medications are reviewed today. There are no reports of any problems with the medications. All of the medical conditions are reviewed and updated.  Lab work is reviewed and will be ordered as medically necessary.   The patient did have an episode of vertigo earlier in the spring.  It only lasted about 1 week.  And the meclizine did help.  He reports that he has not had any more episodes.  He does have a refill of the medication in case he needs it.  He has a couple of skin lesions that are somewhat concerning.  He has one on his right pinna.  He states it is been there for about a year and it will be rough and he tries to put lotion on it to keep it smooth.  He states it has never bled.  He also has a tan rash on his left hip that is flat.  It will sometimes have a little bit of dry skin to it.  No significant itching.  He denies it ever having any blisters, pustules, or erythema.   Past Medical History:  Diagnosis Date  . Hyperlipidemia    Relevant past medical, surgical, family and social history reviewed and updated as indicated. Interim medical history since our last visit reviewed. Allergies and medications reviewed and updated. DATA REVIEWED: CHART IN EPIC  Family History reviewed for pertinent findings.  Review of Systems  Constitutional: Negative.  Negative for appetite change and fatigue.  HENT: Negative.   Eyes: Negative.  Negative for pain and visual disturbance.  Respiratory: Negative.  Negative for cough, chest tightness, shortness of breath and wheezing.    Cardiovascular: Negative.  Negative for chest pain, palpitations and leg swelling.  Gastrointestinal: Negative.  Negative for abdominal pain, diarrhea, nausea and vomiting.  Endocrine: Negative.   Genitourinary: Negative.   Musculoskeletal: Negative.   Skin: Positive for color change and rash.  Neurological: Negative.  Negative for weakness, numbness and headaches.  Psychiatric/Behavioral: Negative.     Allergies as of 11/13/2018   No Known Allergies     Medication List       Accurate as of November 13, 2018 12:48 PM. If you have any questions, ask your nurse or doctor.        acetaminophen 500 MG tablet Commonly known as:  TYLENOL Take 500 mg by mouth every 6 (six) hours as needed.   Fish Oil 1000 MG Caps Take 1,000 mg by mouth daily.   hydrochlorothiazide 25 MG tablet Commonly known as:  HYDRODIURIL 1/2-1 TABLET ONCE A DAY FOR FLUID ORALLY 90 DAYS   meclizine 25 MG tablet Commonly known as:  ANTIVERT Take 1 tablet (25 mg total) by mouth 3 (three) times daily as needed for dizziness.   meloxicam 7.5 MG tablet Commonly known as:  MOBIC Take 1 tablet (7.5 mg total) by mouth daily.   nystatin-triamcinolone ointment Commonly known as:  MYCOLOG Apply 1 application topically 2 (two) times  daily. Started by:  Terald Sleeper, PA-C   omeprazole 20 MG capsule Commonly known as:  PRILOSEC TAKE ONE CAPSULE BY MOUTH TWICE A DAY FOR 90 DAYS   risperiDONE 1 MG tablet Commonly known as:  RISPERDAL Take 1-2 tablets (1-2 mg total) by mouth at bedtime.   simvastatin 40 MG tablet Commonly known as:  ZOCOR Take 1 tablet (40 mg total) by mouth every evening.   tamsulosin 0.4 MG Caps capsule Commonly known as:  FLOMAX Take 1 capsule (0.4 mg total) by mouth daily.   vitamin C 500 MG tablet Commonly known as:  ASCORBIC ACID Take 500 mg by mouth daily.          Objective:    BP 130/73   Pulse (!) 55   Temp (!) 97 F (36.1 C) (Oral)   Ht '5\' 8"'  (1.727 m)   Wt 178 lb 6.4 oz  (80.9 kg)   BMI 27.13 kg/m   No Known Allergies  Wt Readings from Last 3 Encounters:  11/13/18 178 lb 6.4 oz (80.9 kg)  11/25/17 182 lb 9.6 oz (82.8 kg)  10/24/16 185 lb (83.9 kg)    Physical Exam Constitutional:      Appearance: He is well-developed.  HENT:     Head: Normocephalic and atraumatic.  Eyes:     Conjunctiva/sclera: Conjunctivae normal.     Pupils: Pupils are equal, round, and reactive to light.  Neck:     Musculoskeletal: Normal range of motion and neck supple.  Cardiovascular:     Rate and Rhythm: Normal rate and regular rhythm.     Heart sounds: Normal heart sounds.  Pulmonary:     Effort: Pulmonary effort is normal.     Breath sounds: Normal breath sounds.  Abdominal:     General: Bowel sounds are normal.     Palpations: Abdomen is soft.  Musculoskeletal: Normal range of motion.  Skin:    General: Skin is warm and dry.          Comments: Rough scaling patch on right pinna  Multiple flat tan macules on the left hip, minimal dryness.         Assessment & Plan:   1. Well adult exam - PSA - CBC with Differential/Platelet - CMP14+EGFR - Lipid panel - tamsulosin (FLOMAX) 0.4 MG CAPS capsule; Take 1 capsule (0.4 mg total) by mouth daily.  Dispense: 90 capsule; Refill: 3 - simvastatin (ZOCOR) 40 MG tablet; Take 1 tablet (40 mg total) by mouth every evening.  Dispense: 90 tablet; Refill: 3 - risperiDONE (RISPERDAL) 1 MG tablet; Take 1-2 tablets (1-2 mg total) by mouth at bedtime.  Dispense: 180 tablet; Refill: 3 - omeprazole (PRILOSEC) 20 MG capsule; TAKE ONE CAPSULE BY MOUTH TWICE A DAY FOR 90 DAYS  Dispense: 180 capsule; Refill: 3 - meloxicam (MOBIC) 7.5 MG tablet; Take 1 tablet (7.5 mg total) by mouth daily.  Dispense: 90 tablet; Refill: 3 - hydrochlorothiazide (HYDRODIURIL) 25 MG tablet; 1/2-1 TABLET ONCE A DAY FOR FLUID ORALLY 90 DAYS  Dispense: 90 tablet; Refill: 3  2. Precancerous skin lesion - Ambulatory referral to Dermatology  3. Tinea  versicolor - nystatin-triamcinolone ointment (MYCOLOG); Apply 1 application topically 2 (two) times daily.  Dispense: 30 g; Refill: 2   Continue all other maintenance medications as listed above.  Follow up plan: Return in about 1 year (around 11/13/2019).  Educational handout given for [place] PA-C [place], [place]  [phone]   11/13/2018, 12:48 PM

## 2018-11-14 LAB — CMP14+EGFR
ALT: 13 IU/L (ref 0–44)
AST: 17 IU/L (ref 0–40)
Albumin/Globulin Ratio: 1.8 (ref 1.2–2.2)
Albumin: 4.3 g/dL (ref 3.8–4.8)
Alkaline Phosphatase: 65 IU/L (ref 39–117)
BUN/Creatinine Ratio: 10 (ref 10–24)
BUN: 13 mg/dL (ref 8–27)
Bilirubin Total: 0.3 mg/dL (ref 0.0–1.2)
CO2: 24 mmol/L (ref 20–29)
Calcium: 9.7 mg/dL (ref 8.6–10.2)
Chloride: 104 mmol/L (ref 96–106)
Creatinine, Ser: 1.28 mg/dL — ABNORMAL HIGH (ref 0.76–1.27)
GFR calc Af Amer: 66 mL/min/{1.73_m2} (ref >59)
GFR calc non Af Amer: 57 mL/min/{1.73_m2} — ABNORMAL LOW (ref >59)
Globulin, Total: 2.4 g/dL (ref 1.5–4.5)
Glucose: 103 mg/dL — ABNORMAL HIGH (ref 65–99)
Potassium: 5.2 mmol/L (ref 3.5–5.2)
Sodium: 147 mmol/L — ABNORMAL HIGH (ref 134–144)
Total Protein: 6.7 g/dL (ref 6.0–8.5)

## 2018-11-14 LAB — CBC WITH DIFFERENTIAL/PLATELET
Basophils Absolute: 0.1 10*3/uL (ref 0.0–0.2)
Basos: 1 %
EOS (ABSOLUTE): 0.4 10*3/uL (ref 0.0–0.4)
Eos: 5 %
Hematocrit: 40.9 % (ref 37.5–51.0)
Hemoglobin: 13.5 g/dL (ref 13.0–17.7)
Immature Grans (Abs): 0 10*3/uL (ref 0.0–0.1)
Immature Granulocytes: 0 %
Lymphocytes Absolute: 2.4 10*3/uL (ref 0.7–3.1)
Lymphs: 28 %
MCH: 30.9 pg (ref 26.6–33.0)
MCHC: 33 g/dL (ref 31.5–35.7)
MCV: 94 fL (ref 79–97)
Monocytes Absolute: 0.8 10*3/uL (ref 0.1–0.9)
Monocytes: 9 %
Neutrophils Absolute: 5 10*3/uL (ref 1.4–7.0)
Neutrophils: 57 %
Platelets: 269 10*3/uL (ref 150–450)
RBC: 4.37 x10E6/uL (ref 4.14–5.80)
RDW: 13.8 % (ref 11.6–15.4)
WBC: 8.7 10*3/uL (ref 3.4–10.8)

## 2018-11-14 LAB — LIPID PANEL
Chol/HDL Ratio: 5.1 ratio — ABNORMAL HIGH (ref 0.0–5.0)
Cholesterol, Total: 197 mg/dL (ref 100–199)
HDL: 39 mg/dL — ABNORMAL LOW (ref >39)
LDL Calculated: 121 mg/dL — ABNORMAL HIGH (ref 0–99)
Triglycerides: 184 mg/dL — ABNORMAL HIGH (ref 0–149)
VLDL Cholesterol Cal: 37 mg/dL (ref 5–40)

## 2018-11-14 LAB — PSA: Prostate Specific Ag, Serum: 0.5 ng/mL (ref 0.0–4.0)

## 2019-02-09 ENCOUNTER — Other Ambulatory Visit: Payer: Self-pay | Admitting: Physician Assistant

## 2019-02-09 DIAGNOSIS — Z0000 Encounter for general adult medical examination without abnormal findings: Secondary | ICD-10-CM

## 2019-02-09 MED ORDER — TAMSULOSIN HCL 0.4 MG PO CAPS: 0.8000 mg | ORAL_CAPSULE | Freq: Every day | ORAL | 3 refills | Status: AC

## 2019-02-22 ENCOUNTER — Ambulatory Visit: Payer: BC Managed Care – PPO | Admitting: Family Medicine

## 2019-02-23 ENCOUNTER — Ambulatory Visit: Payer: BC Managed Care – PPO | Admitting: Nurse Practitioner

## 2019-02-23 ENCOUNTER — Other Ambulatory Visit: Payer: Self-pay

## 2019-02-23 ENCOUNTER — Ambulatory Visit (INDEPENDENT_AMBULATORY_CARE_PROVIDER_SITE_OTHER): Payer: BC Managed Care – PPO

## 2019-02-23 ENCOUNTER — Encounter: Payer: Self-pay | Admitting: Nurse Practitioner

## 2019-02-23 VITALS — BP 118/58 | HR 67 | Temp 96.2°F | Resp 16 | Ht 68.0 in | Wt 174.0 lb

## 2019-02-23 DIAGNOSIS — R39198 Other difficulties with micturition: Secondary | ICD-10-CM

## 2019-02-23 DIAGNOSIS — R194 Change in bowel habit: Secondary | ICD-10-CM

## 2019-02-23 DIAGNOSIS — R338 Other retention of urine: Secondary | ICD-10-CM | POA: Diagnosis not present

## 2019-02-23 DIAGNOSIS — N401 Enlarged prostate with lower urinary tract symptoms: Secondary | ICD-10-CM | POA: Diagnosis not present

## 2019-02-23 DIAGNOSIS — R109 Unspecified abdominal pain: Secondary | ICD-10-CM | POA: Diagnosis not present

## 2019-02-23 NOTE — Patient Instructions (Signed)

## 2019-02-23 NOTE — Progress Notes (Signed)
   Subjective:    Patient ID: Trevor Wu, male    DOB: April 24, 1951, 68 y.o.   MRN: [id number]   Chief Complaint: Can not urinate or have a BM (Has been days. Can't sleep)   HPI Patient comes in c/o trouble urinating fro days. The only way he can void is to get in the shower. He says his has suprapubic pain and has had some constipation.   Review of Systems  Constitutional: Negative for activity change and appetite change.  HENT: Negative.   Eyes: Negative for pain.  Respiratory: Negative for shortness of breath.   Cardiovascular: Negative for chest pain, palpitations and leg swelling.  Gastrointestinal: Negative for abdominal pain.  Endocrine: Negative for polydipsia.  Genitourinary: Negative.   Skin: Negative for rash.  Neurological: Negative for dizziness, weakness and headaches.  Hematological: Does not bruise/bleed easily.  Psychiatric/Behavioral: Negative.   All other systems reviewed and are negative.      Objective:   Physical Exam Vitals signs and nursing note reviewed.  Constitutional:      Appearance: Normal appearance.  Cardiovascular:     Rate and Rhythm: Normal rate and regular rhythm.     Pulses: Normal pulses.     Heart sounds: Normal heart sounds.  Pulmonary:     Effort: Pulmonary effort is normal.  Abdominal:     General: Abdomen is flat. Bowel sounds are normal.     Tenderness: There is abdominal tenderness (left lower quadrant).  Skin:    General: Skin is warm and dry.  Neurological:     Mental Status: He is alert.    BP (!) 118/58   Pulse 67   Temp (!) 96.2 F (35.7 C) (Oral)   Resp 16   Ht 5\' 8"  (1.727 m)   Wt 174 lb (78.9 kg)   SpO2 98%   BMI 26.46 kg/m    KUB- moderate stool burden throughout colon     Assessment & Plan:  Trevor Wu in today with chief complaint of Can not urinate or have a BM (Has been days. Can't sleep)   1. Change in bowel habits miralax daily - DG Abd 1 View; Future  2. Voiding difficulty  Stat referral to urology - Ambulatory referral to Urology  Trevor Wu, Trevor Wu

## 2019-03-02 DIAGNOSIS — N401 Enlarged prostate with lower urinary tract symptoms: Secondary | ICD-10-CM | POA: Diagnosis not present

## 2019-03-02 DIAGNOSIS — R338 Other retention of urine: Secondary | ICD-10-CM | POA: Diagnosis not present

## 2019-03-03 DIAGNOSIS — N401 Enlarged prostate with lower urinary tract symptoms: Secondary | ICD-10-CM | POA: Diagnosis not present

## 2019-03-03 DIAGNOSIS — R338 Other retention of urine: Secondary | ICD-10-CM | POA: Diagnosis not present

## 2019-03-03 DIAGNOSIS — B952 Enterococcus as the cause of diseases classified elsewhere: Secondary | ICD-10-CM | POA: Diagnosis not present

## 2019-03-03 DIAGNOSIS — N390 Urinary tract infection, site not specified: Secondary | ICD-10-CM | POA: Diagnosis not present

## 2019-03-18 DIAGNOSIS — R3914 Feeling of incomplete bladder emptying: Secondary | ICD-10-CM | POA: Diagnosis not present

## 2019-03-18 DIAGNOSIS — R3912 Poor urinary stream: Secondary | ICD-10-CM | POA: Diagnosis not present

## 2019-03-18 DIAGNOSIS — N401 Enlarged prostate with lower urinary tract symptoms: Secondary | ICD-10-CM | POA: Diagnosis not present

## 2019-03-18 DIAGNOSIS — R351 Nocturia: Secondary | ICD-10-CM | POA: Diagnosis not present

## 2019-06-21 DIAGNOSIS — Z20828 Contact with and (suspected) exposure to other viral communicable diseases: Secondary | ICD-10-CM | POA: Diagnosis not present

## 2019-06-26 DIAGNOSIS — R6883 Chills (without fever): Secondary | ICD-10-CM | POA: Diagnosis not present

## 2019-06-26 DIAGNOSIS — R0602 Shortness of breath: Secondary | ICD-10-CM | POA: Diagnosis not present

## 2019-06-26 DIAGNOSIS — R05 Cough: Secondary | ICD-10-CM | POA: Diagnosis not present

## 2019-06-26 DIAGNOSIS — R509 Fever, unspecified: Secondary | ICD-10-CM | POA: Diagnosis not present

## 2019-07-15 DIAGNOSIS — Z20828 Contact with and (suspected) exposure to other viral communicable diseases: Secondary | ICD-10-CM | POA: Diagnosis not present

## 2019-10-28 DIAGNOSIS — E782 Mixed hyperlipidemia: Secondary | ICD-10-CM | POA: Diagnosis not present

## 2019-10-28 DIAGNOSIS — N401 Enlarged prostate with lower urinary tract symptoms: Secondary | ICD-10-CM | POA: Diagnosis not present

## 2019-10-28 DIAGNOSIS — I10 Essential (primary) hypertension: Secondary | ICD-10-CM | POA: Diagnosis not present

## 2019-10-28 DIAGNOSIS — K219 Gastro-esophageal reflux disease without esophagitis: Secondary | ICD-10-CM | POA: Diagnosis not present

## 2019-11-09 ENCOUNTER — Other Ambulatory Visit: Payer: Self-pay

## 2019-11-09 DIAGNOSIS — Z0000 Encounter for general adult medical examination without abnormal findings: Secondary | ICD-10-CM

## 2019-11-09 MED ORDER — OMEPRAZOLE 20 MG PO CPDR: DELAYED_RELEASE_CAPSULE | ORAL | 0 refills | Status: AC

## 2019-12-04 ENCOUNTER — Other Ambulatory Visit: Payer: Self-pay | Admitting: Family Medicine

## 2019-12-04 DIAGNOSIS — Z0000 Encounter for general adult medical examination without abnormal findings: Secondary | ICD-10-CM

## 2019-12-06 NOTE — Telephone Encounter (Signed)
Last refill was 30 day supply sent in 11/09/19   Needs an appt for further refills

## 2019-12-07 NOTE — Telephone Encounter (Signed)
Patient has changed practices and is now going to a doctor in [facility] where he lives.  He is unsure why [facility] sent this to [place] but will call them.

## 2019-12-08 ENCOUNTER — Other Ambulatory Visit: Payer: Self-pay | Admitting: *Deleted

## 2019-12-08 DIAGNOSIS — Z0000 Encounter for general adult medical examination without abnormal findings: Secondary | ICD-10-CM

## 2019-12-08 NOTE — Telephone Encounter (Signed)
Trevor Wu pt - needs new PCP and OV for refills  Denied - Risperidone 1 mg today  ntbs

## 2019-12-08 NOTE — Telephone Encounter (Signed)
mobic denied as well - NTBS with a new PCP

## 2019-12-08 NOTE — Telephone Encounter (Signed)
Spoke with patient, he and his wife have switched PCP's to an office in [facility] where they live.  He will contact [facility] and ask them not to send this to [place].

## 2019-12-09 DIAGNOSIS — H40013 Open angle with borderline findings, low risk, bilateral: Secondary | ICD-10-CM | POA: Diagnosis not present

## 2019-12-09 DIAGNOSIS — H35033 Hypertensive retinopathy, bilateral: Secondary | ICD-10-CM | POA: Diagnosis not present

## 2020-01-21 DIAGNOSIS — E782 Mixed hyperlipidemia: Secondary | ICD-10-CM | POA: Diagnosis not present

## 2020-01-21 DIAGNOSIS — Z1321 Encounter for screening for nutritional disorder: Secondary | ICD-10-CM | POA: Diagnosis not present

## 2020-01-21 DIAGNOSIS — I10 Essential (primary) hypertension: Secondary | ICD-10-CM | POA: Diagnosis not present

## 2020-01-21 DIAGNOSIS — Z125 Encounter for screening for malignant neoplasm of prostate: Secondary | ICD-10-CM | POA: Diagnosis not present

## 2020-01-21 DIAGNOSIS — Z131 Encounter for screening for diabetes mellitus: Secondary | ICD-10-CM | POA: Diagnosis not present

## 2020-01-27 DIAGNOSIS — N1831 Chronic kidney disease, stage 3a: Secondary | ICD-10-CM | POA: Diagnosis not present

## 2020-01-27 DIAGNOSIS — E785 Hyperlipidemia, unspecified: Secondary | ICD-10-CM | POA: Diagnosis not present

## 2020-01-27 DIAGNOSIS — R001 Bradycardia, unspecified: Secondary | ICD-10-CM | POA: Diagnosis not present

## 2020-01-27 DIAGNOSIS — I129 Hypertensive chronic kidney disease with stage 1 through stage 4 chronic kidney disease, or unspecified chronic kidney disease: Secondary | ICD-10-CM | POA: Diagnosis not present

## 2020-02-07 ENCOUNTER — Other Ambulatory Visit: Payer: Self-pay

## 2020-02-07 DIAGNOSIS — Z0000 Encounter for general adult medical examination without abnormal findings: Secondary | ICD-10-CM

## 2020-02-16 DIAGNOSIS — H43393 Other vitreous opacities, bilateral: Secondary | ICD-10-CM | POA: Diagnosis not present

## 2020-03-20 DIAGNOSIS — K5730 Diverticulosis of large intestine without perforation or abscess without bleeding: Secondary | ICD-10-CM | POA: Diagnosis not present

## 2020-03-20 DIAGNOSIS — K641 Second degree hemorrhoids: Secondary | ICD-10-CM | POA: Diagnosis not present

## 2020-03-20 DIAGNOSIS — Z800 Family history of malignant neoplasm of digestive organs: Secondary | ICD-10-CM | POA: Diagnosis not present

## 2020-03-20 DIAGNOSIS — Z1211 Encounter for screening for malignant neoplasm of colon: Secondary | ICD-10-CM | POA: Diagnosis not present

## 2020-03-20 DIAGNOSIS — Z1212 Encounter for screening for malignant neoplasm of rectum: Secondary | ICD-10-CM | POA: Diagnosis not present

## 2020-10-17 IMAGING — DX DG ABDOMEN 1V
2 series · 2 of 2 positions shown · non-contrast
Comparison: None.

CLINICAL DATA: Mid abdominal pain for 2-3 weeks with bowel habit
changes and low back pain started 2-3 days ago.

EXAM:
ABDOMEN - 1 VIEW

[abdomen kub (1 of 2)]
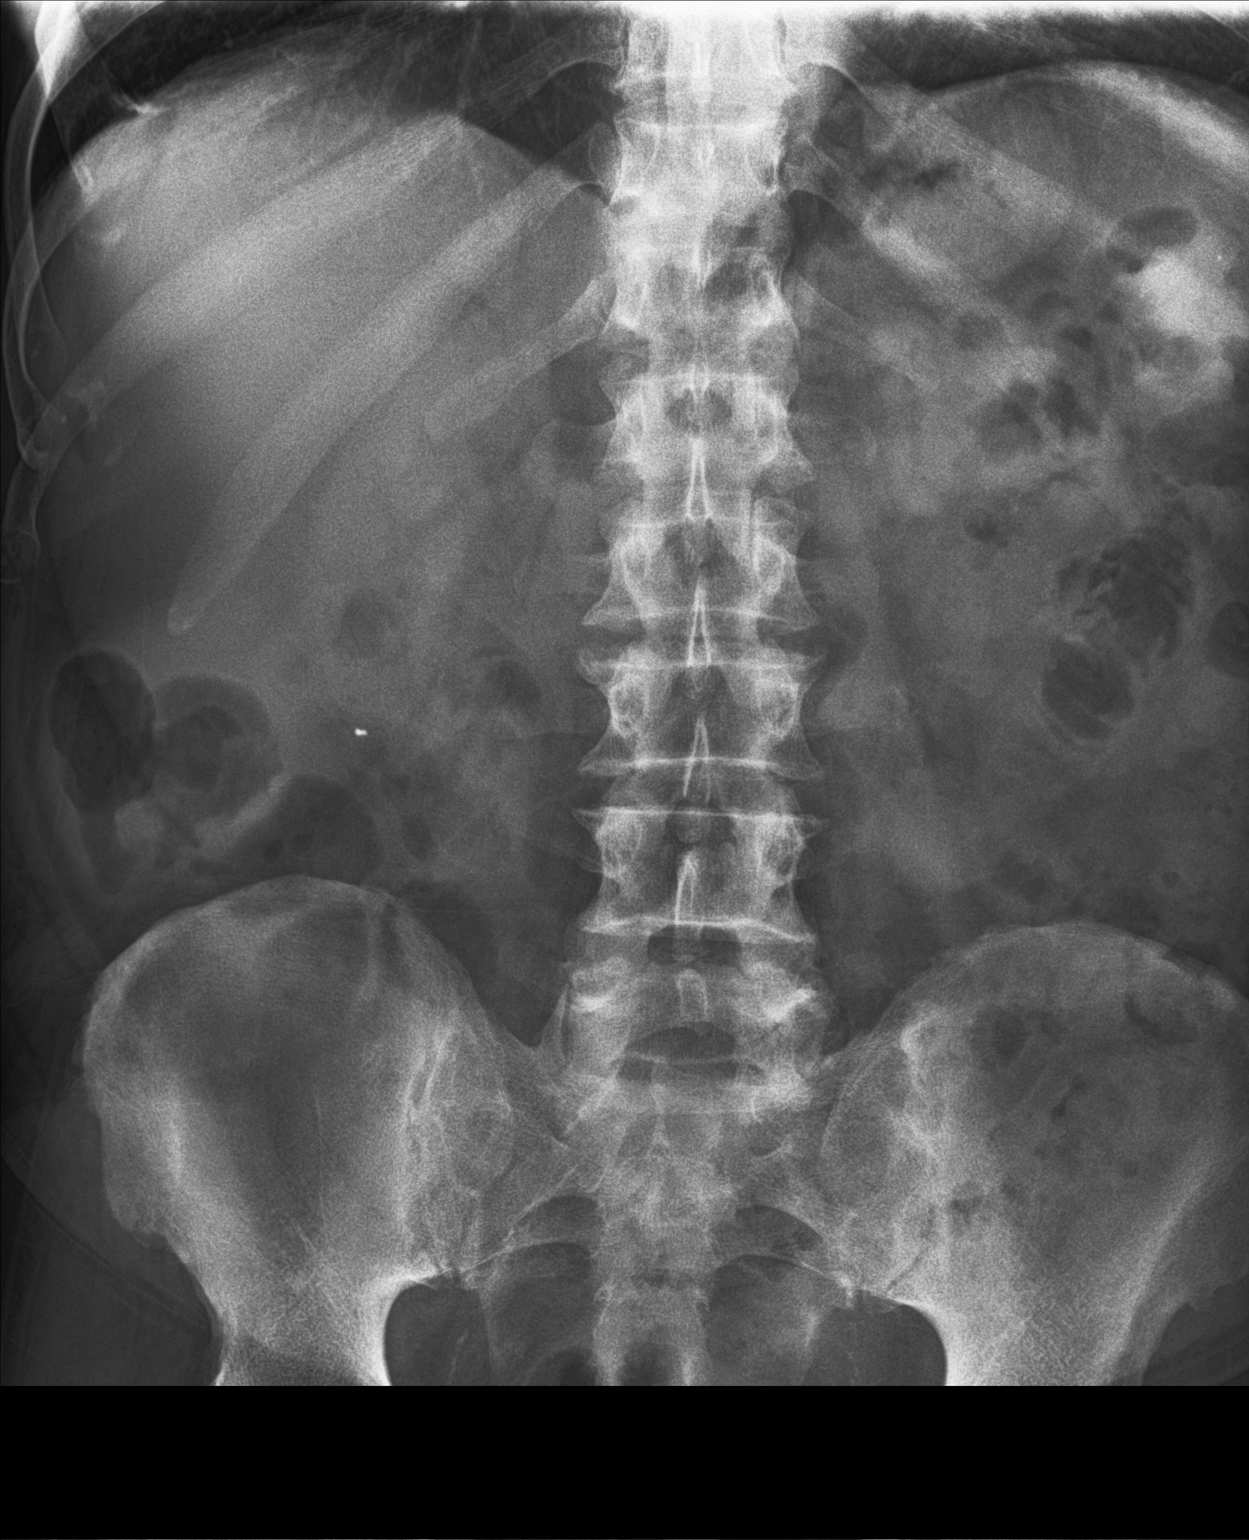

[abdomen kub (2 of 2)]
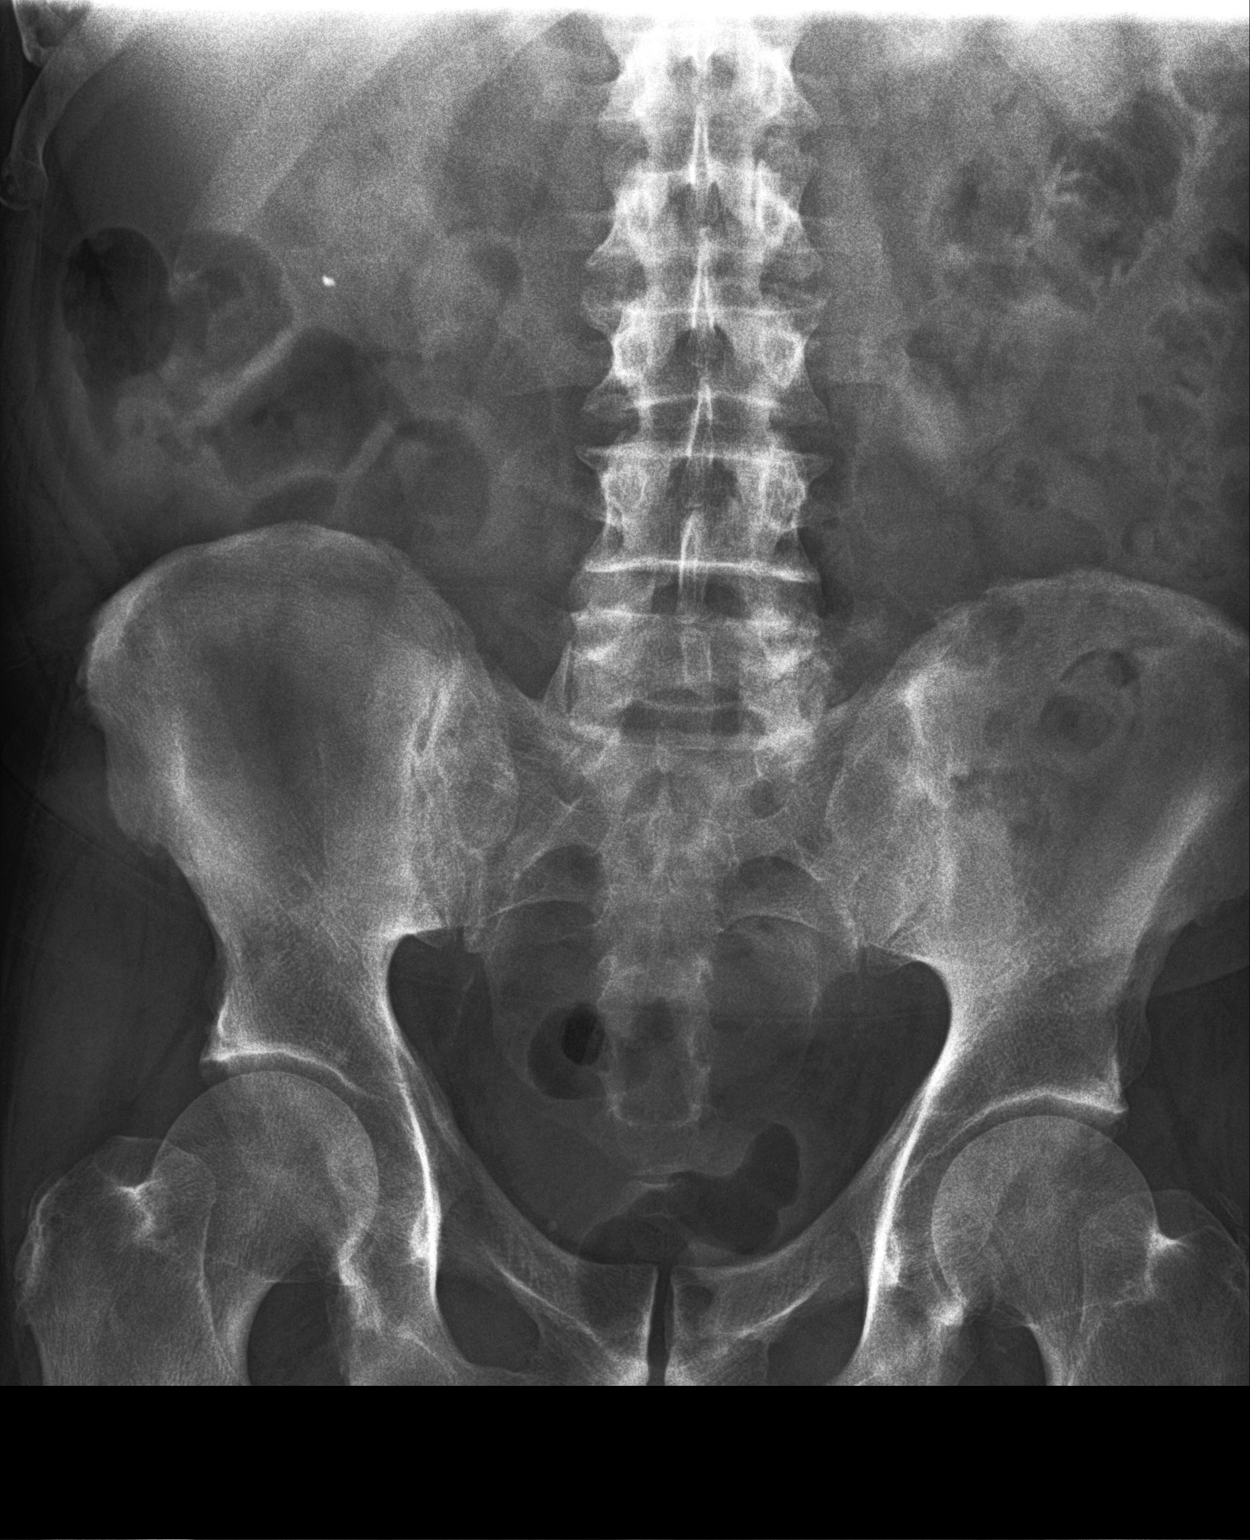

[2 of 2 positions shown; findings below may reference images not displayed]

FINDINGS: There are nondilated loops of bowel throughout the abdomen in a
nonobstructive pattern. Gas is seen to the level the rectum. No
evidence for free air. Visualized portions of the lung bases are
clear. No acute osseous abnormality in the visualized skeleton.
IMPRESSION: Nonobstructive bowel gas pattern.

## 2024-06-05 ENCOUNTER — Other Ambulatory Visit: Payer: Self-pay

## 2024-06-05 ENCOUNTER — Encounter (HOSPITAL_BASED_OUTPATIENT_CLINIC_OR_DEPARTMENT_OTHER): Payer: Self-pay

## 2024-06-05 ENCOUNTER — Emergency Department (HOSPITAL_BASED_OUTPATIENT_CLINIC_OR_DEPARTMENT_OTHER)
Admission: EM | Admit: 2024-06-05 | Discharge: 2024-06-05 | Disposition: A | Discharge status: Home / Self Care | Attending: Emergency Medicine | Admitting: Emergency Medicine

## 2024-06-05 ENCOUNTER — Emergency Department (HOSPITAL_BASED_OUTPATIENT_CLINIC_OR_DEPARTMENT_OTHER)

## 2024-06-05 DIAGNOSIS — I4891 Unspecified atrial fibrillation: Secondary | ICD-10-CM | POA: Insufficient documentation

## 2024-06-05 DIAGNOSIS — R059 Cough, unspecified: Secondary | ICD-10-CM | POA: Diagnosis present

## 2024-06-05 DIAGNOSIS — J069 Acute upper respiratory infection, unspecified: Secondary | ICD-10-CM | POA: Diagnosis not present

## 2024-06-05 DIAGNOSIS — Z79899 Other long term (current) drug therapy: Secondary | ICD-10-CM | POA: Diagnosis not present

## 2024-06-05 LAB — RESP PANEL BY RT-PCR (RSV, FLU A&B, COVID)  RVPGX2
Influenza A by PCR: NEGATIVE
Influenza B by PCR: NEGATIVE
Resp Syncytial Virus by PCR: NEGATIVE
SARS Coronavirus 2 by RT PCR: NEGATIVE

## 2024-06-05 MED ORDER — BENZONATATE 100 MG PO CAPS: 100.0000 mg | ORAL_CAPSULE | Freq: Three times a day (TID) | ORAL | 0 refills | Status: AC

## 2024-06-05 NOTE — ED Triage Notes (Signed)
 Cough, congestion, sweating, chills, headache, fatigue since yesterday   States family member recently sick w similar symptoms

## 2024-06-05 NOTE — Discharge Instructions (Addendum)
 Make an appointment to follow-up with your primary care doctor if your symptoms are not improving in the next few days.  Return to the emergency room if you have any worsening symptoms.

## 2024-06-05 NOTE — ED Notes (Signed)

## 2024-06-05 NOTE — ED Provider Notes (Signed)
 " [place] [facility] Provider Note   CSN: [id number] Arrival date & time: 06/05/24  1547     Patient presents with: Cough   Trevor Wu is a 73 y.o. male.   Patient is a 73 year old who presents with a 2-day history of runny nose congestion and coughing.  He has felt fatigued.  He feels a little short of breath at times.  He said he had 2 family members over recently that both were diagnosed with influenza with similar symptoms.  He has felt hot but has not checked his temperature.  He has had no vomiting.  He denies any history of underlying lung disease.  He does have a history of cardiac disease as well as atrial fibrillation status post Watchman procedure.  He denies any change in his stools.       Prior to Admission medications  Medication Sig Start Date End Date Taking? Authorizing Provider  benzonatate  (TESSALON ) 100 MG capsule Take 1 capsule (100 mg total) by mouth every 8 (eight) hours. 06/05/24  Yes Lenor Hollering, MD  acetaminophen (TYLENOL) 500 MG tablet Take 500 mg by mouth every 6 (six) hours as needed.    [provider]  hydrochlorothiazide  (HYDRODIURIL ) 25 MG tablet 1/2-1 TABLET ONCE A DAY FOR FLUID ORALLY 90 DAYS 11/13/18   Joshua Clayborne RAMAN, PA-C  meclizine  (ANTIVERT ) 25 MG tablet Take 1 tablet (25 mg total) by mouth 3 (three) times daily as needed for dizziness. Patient not taking: Reported on 02/23/2019 10/09/18   Joshua Clayborne RAMAN, PA-C  meloxicam  (MOBIC ) 7.5 MG tablet Take 1 tablet (7.5 mg total) by mouth daily. 11/13/18   Joshua Clayborne RAMAN, PA-C  nystatin -triamcinolone  ointment (MYCOLOG) Apply 1 application topically 2 (two) times daily. Patient not taking: Reported on 02/23/2019 11/13/18   Joshua Clayborne RAMAN, PA-C  Omega-3 Fatty Acids (FISH OIL) 1000 MG CAPS Take 1,000 mg by mouth daily.    [provider]  omeprazole  (PRILOSEC) 20 MG capsule TAKE ONE CAPSULE BY MOUTH TWICE A DAY.   Needs to be seen for further refills.  11/09/19   Zollie Lowers, MD  risperiDONE  (RISPERDAL ) 1 MG tablet Take 1-2 tablets (1-2 mg total) by mouth at bedtime. 11/13/18   Joshua Clayborne RAMAN, PA-C  simvastatin  (ZOCOR ) 40 MG tablet Take 1 tablet (40 mg total) by mouth every evening. 11/13/18   Joshua Clayborne RAMAN, PA-C  tamsulosin  (FLOMAX ) 0.4 MG CAPS capsule Take 2 capsules (0.8 mg total) by mouth daily. 02/09/19   Joshua Clayborne RAMAN, PA-C  vitamin C (ASCORBIC ACID) 500 MG tablet Take 500 mg by mouth daily.    [provider]    Allergies: Patient has no known allergies.    Review of Systems  Constitutional:  Positive for fatigue and fever (Subjective). Negative for chills and diaphoresis.  HENT:  Positive for congestion and rhinorrhea. Negative for sneezing.   Eyes: Negative.   Respiratory:  Positive for cough and shortness of breath. Negative for chest tightness.   Cardiovascular:  Negative for chest pain and leg swelling.  Gastrointestinal:  Negative for abdominal pain, diarrhea, nausea and vomiting.  Genitourinary:  Negative for difficulty urinating, flank pain and frequency.  Musculoskeletal:  Negative for arthralgias and back pain.  Skin:  Negative for rash.  Neurological:  Negative for dizziness, speech difficulty, weakness, numbness and headaches.    Updated Vital Signs BP (!) 140/87 (BP Location: Right Arm)   Pulse 82   Temp 97.9 F (36.6 C)  Resp 18   SpO2 98%   Physical Exam Constitutional:      Appearance: He is well-developed.  HENT:     Head: Normocephalic and atraumatic.  Eyes:     Pupils: Pupils are equal, round, and reactive to light.  Cardiovascular:     Rate and Rhythm: Normal rate and regular rhythm.     Heart sounds: Normal heart sounds.  Pulmonary:     Effort: Pulmonary effort is normal. No respiratory distress.     Breath sounds: Normal breath sounds. No wheezing or rales.  Chest:     Chest wall: No tenderness.  Abdominal:     General: Bowel sounds are normal.     Palpations: Abdomen is soft.      Tenderness: There is no abdominal tenderness. There is no guarding or rebound.  Musculoskeletal:        General: Normal range of motion.     Cervical back: Normal range of motion and neck supple.     Comments: No edema or calf tenderness  Lymphadenopathy:     Cervical: No cervical adenopathy.  Skin:    General: Skin is warm and dry.     Findings: No rash.  Neurological:     Mental Status: He is alert and oriented to person, place, and time.     (all labs ordered are listed, but only abnormal results are displayed) Labs Reviewed  RESP PANEL BY RT-PCR (RSV, FLU A&B, COVID)  [id number]    EKG: None  Radiology: DG Chest 2 View Result Date: 06/05/2024 CLINICAL DATA:  Cough.  Flu exposure. EXAM: CHEST - 2 VIEW COMPARISON:  None Available. FINDINGS: Left-sided pacemaker in place. Status post median sternotomy with mitral plasty.The cardiomediastinal contours are normal. The lungs are clear. Pulmonary vasculature is normal. No consolidation, pleural effusion, or pneumothorax. No acute osseous abnormalities are seen. IMPRESSION: No active cardiopulmonary disease. Electronically Signed   By: Andrea Gasman M.D.   On: 06/05/2024 17:48     Procedures   Medications Ordered in the ED - No data to display                                  Medical Decision Making Amount and/or Complexity of Data Reviewed Radiology: ordered.  Risk Prescription drug management.   This patient presents to the ED for concern of cough, this involves an extensive number of treatment options, and is a complaint that carries with it a high risk of complications and morbidity.  I considered the following differential and admission for this acute, potentially life threatening condition.  The differential diagnosis includes viral URI, pneumonia, pulmonary edema, PE  MDM:    Patient is a 73 year old who presents with subjective fever, runny nose and coughing since yesterday.  He has felt more fatigued than normal.   Chest x-ray does not show any evidence of pneumonia.  His COVID/flu/RSV is negative.  His oxygen levels are normal.  His lungs are clear on exam.  He does not have any suggestions of fluid overload.  He was discharged home in good condition.  He was advised on symptomatic care.  Was given a prescription for Tessalon  [place].  Was advised to follow-up with his PCP if his symptoms are not improving in the next few days.  Return precautions were given.  (Labs, imaging, consults)  Labs: I Ordered, and personally interpreted labs.  The pertinent results include: COVID/flu negative  Imaging Studies  ordered: I ordered imaging studies including chest x-ray I independently visualized and interpreted imaging. I agree with the radiologist interpretation  Additional history obtained from  .  External records from outside source obtained and reviewed including prior history  Cardiac Monitoring: The patient was not maintained on a cardiac monitor.  If on the cardiac monitor, I personally viewed and interpreted the cardiac monitored which showed an underlying rhythm of:    Reevaluation: After the interventions noted above, I reevaluated the patient and found that they have :stayed the same  Social Determinants of Health:    Disposition: Discharged to home  Co morbidities that complicate the patient evaluation  Past Medical History:  Diagnosis Date   Hyperlipidemia      Medicines Meds ordered this encounter  Medications   benzonatate  (TESSALON ) 100 MG capsule    Sig: Take 1 capsule (100 mg total) by mouth every 8 (eight) hours.    Dispense:  21 capsule    Refill:  0    I have reviewed the patients home medicines and have made adjustments as needed  Problem List / ED Course: Problem List Items Addressed This Visit   None Visit Diagnoses       Viral URI with cough    -  Primary                Final diagnoses:  Viral URI with cough    ED Discharge Orders          Ordered     benzonatate  (TESSALON ) 100 MG capsule  Every 8 hours        06/05/24 2031               Lenor Hollering, MD 06/05/24 2035  "
